# Patient Record
Sex: Male | Born: 1950 | Race: White | Hispanic: No | Marital: Married | Smoking: Current every day smoker
Health system: Southern US, Community
[De-identification: ages and names within clinical notes are randomized; demographics above are authoritative.]

## PROBLEM LIST (undated history)

## (undated) DIAGNOSIS — E785 Hyperlipidemia, unspecified: Secondary | ICD-10-CM

## (undated) DIAGNOSIS — G8929 Other chronic pain: Secondary | ICD-10-CM

## (undated) DIAGNOSIS — F329 Major depressive disorder, single episode, unspecified: Secondary | ICD-10-CM

## (undated) DIAGNOSIS — E079 Disorder of thyroid, unspecified: Secondary | ICD-10-CM

## (undated) DIAGNOSIS — Z765 Malingerer [conscious simulation]: Secondary | ICD-10-CM

## (undated) DIAGNOSIS — I10 Essential (primary) hypertension: Secondary | ICD-10-CM

## (undated) DIAGNOSIS — E119 Type 2 diabetes mellitus without complications: Secondary | ICD-10-CM

---

## 1998-10-03 ENCOUNTER — Other Ambulatory Visit: Admission: RE | Admit: 1998-10-03 | Discharge: 1998-10-03 | Payer: Self-pay | Admitting: Otolaryngology

## 2002-03-01 ENCOUNTER — Encounter: Payer: Self-pay | Admitting: Emergency Medicine

## 2002-03-01 ENCOUNTER — Inpatient Hospital Stay (HOSPITAL_COMMUNITY): Admission: EM | Admit: 2002-03-01 | Discharge: 2002-03-05 | Payer: Self-pay | Admitting: Emergency Medicine

## 2002-03-03 ENCOUNTER — Encounter: Payer: Self-pay | Admitting: Cardiology

## 2002-06-14 ENCOUNTER — Ambulatory Visit (HOSPITAL_COMMUNITY): Admission: RE | Admit: 2002-06-14 | Discharge: 2002-06-14 | Payer: Self-pay | Admitting: Gastroenterology

## 2016-02-11 MED FILL — OxyCONTIN 80 MG T12A: 80 | 30 days supply | Qty: 60 | Fill #0

## 2016-02-11 MED FILL — ALPRAZolam 1 MG TABS: 1 | 30 days supply | Qty: 60 | Fill #0

## 2016-02-11 MED FILL — CARISOPRODOL 350 MG TABLET: 350 | 30 days supply | Qty: 60 | Fill #0

## 2018-07-12 MED FILL — OxyCONTIN 60 MG T12A: 60 | 30 days supply | Qty: 60 | Fill #0

## 2018-07-12 MED FILL — CARISOPRODOL 350 MG TABS: 350 | 30 days supply | Qty: 60 | Fill #0

## 2018-07-12 MED FILL — oxyCODONE HCL 10 MG TABS: 10 | 8 days supply | Qty: 45 | Fill #0

## 2018-07-12 MED FILL — ALPRAZolam 1 MG TABS: 1 | 30 days supply | Qty: 60 | Fill #0

## 2018-08-09 MED FILL — VALSARTAN 80 MG TABLET: 80 | 45 days supply | Qty: 90 | Fill #0

## 2018-08-09 MED FILL — HYDROCHLOROTHIAZIDE 12.5 MG: 12.5 | 45 days supply | Qty: 90 | Fill #0

## 2018-08-10 MED FILL — ALPRAZolam 1 MG TABS: 1 | 30 days supply | Qty: 60 | Fill #0

## 2018-08-10 MED FILL — CARISOPRODOL 350 MG TABS: 350 | 30 days supply | Qty: 60 | Fill #0

## 2018-08-10 MED FILL — oxyCODONE HCL 10 MG TABS: 10 | 7 days supply | Qty: 45 | Fill #0

## 2018-08-10 MED FILL — OxyCONTIN 60 MG T12A: 60 | 30 days supply | Qty: 60 | Fill #0

## 2018-08-14 MED FILL — TAMSULOSIN HCL 0.4 MG CAP: 0.4 | 90 days supply | Qty: 90 | Fill #0

## 2018-08-17 MED FILL — traZODone HCL 50 MG TABS: 50 | 30 days supply | Qty: 15 | Fill #0

## 2018-08-17 MED FILL — ADVAIR 250/50 DISKUS: 250-50 | 30 days supply | Qty: 60 | Fill #0

## 2018-08-17 MED FILL — PROAIR HFA 90 MCG INHALER: 108 (90 BAS | 25 days supply | Qty: 9 | Fill #0

## 2018-08-29 MED FILL — FEXOFENADINE HCL 180 MG TAB: 180 | 30 days supply | Qty: 30 | Fill #0

## 2018-08-29 MED FILL — metFORMIN HCL 1000 MG TABS: 1000 | 90 days supply | Qty: 180 | Fill #0

## 2018-09-08 MED FILL — OxyCONTIN 60 MG T12A: 60 | 30 days supply | Qty: 60 | Fill #0

## 2018-09-08 MED FILL — oxyCODONE HCL 10 MG TABS: 10 | 30 days supply | Qty: 60 | Fill #0

## 2018-09-08 MED FILL — ALPRAZolam 1 MG TABS: 1 | 30 days supply | Qty: 30 | Fill #0

## 2018-09-11 MED FILL — CARISOPRODOL 350 MG TABS: 350 | 30 days supply | Qty: 60 | Fill #0

## 2018-09-26 MED FILL — traZODone HCL 50 MG TABS: 50 | 30 days supply | Qty: 30 | Fill #0

## 2018-09-26 MED FILL — TRINTELLIX 10 MG TABLET: 10 | 30 days supply | Qty: 30 | Fill #0

## 2018-10-09 MED FILL — CARISOPRODOL 350 MG TABS: 350 | 30 days supply | Qty: 60 | Fill #0

## 2018-10-09 MED FILL — oxyCODONE HCL 10 MG TABS: 10 | 60 days supply | Qty: 60 | Fill #0

## 2018-10-09 MED FILL — DOK 100 MG SOFTGEL: 100 | 50 days supply | Qty: 100 | Fill #0

## 2018-10-09 MED FILL — OxyCONTIN 60 MG T12A: 60 | 30 days supply | Qty: 60 | Fill #0

## 2018-10-09 MED FILL — ALPRAZolam 1 MG TABS: 1 | 30 days supply | Qty: 30 | Fill #0

## 2018-11-06 MED FILL — FLUOXETINE HCL 20 MG TABS: 20 | 30 days supply | Qty: 30 | Fill #0

## 2018-11-07 MED FILL — CARISOPRODOL 350 MG TABS: 350 | 30 days supply | Qty: 60 | Fill #0

## 2018-11-07 MED FILL — ALPRAZolam 1 MG TABS: 1 | 30 days supply | Qty: 30 | Fill #0

## 2018-11-07 MED FILL — oxyCODONE HCL 15 MG TABS: 15 | 30 days supply | Qty: 60 | Fill #0

## 2018-11-07 MED FILL — OxyCONTIN 60 MG T12A: 60 | 30 days supply | Qty: 60 | Fill #0

## 2018-11-09 MED FILL — LEVOTHYROXINE 200 MCG TAB: 200 | 30 days supply | Qty: 30 | Fill #0

## 2018-11-09 MED FILL — FLUoxetine HCL 40 MG CAPS: 40 | 90 days supply | Qty: 90 | Fill #0

## 2018-12-01 MED FILL — FLUOXETINE HCL 20 MG TABS: 20 | 30 days supply | Qty: 30 | Fill #1

## 2018-12-05 MED FILL — MAGNESIUM OXIDE 400 MG TAB: 400 (240 MG | 60 days supply | Qty: 120 | Fill #0

## 2018-12-08 MED FILL — CARISOPRODOL 350 MG TABS: 350 | 30 days supply | Qty: 60 | Fill #0

## 2018-12-08 MED FILL — OxyCONTIN 60 MG T12A: 60 | 30 days supply | Qty: 60 | Fill #0

## 2018-12-08 MED FILL — oxyCODONE HCL 15 MG TABS: 15 | 30 days supply | Qty: 60 | Fill #0

## 2018-12-08 MED FILL — ALPRAZolam 1 MG TABS: 1 | 15 days supply | Qty: 30 | Fill #0

## 2019-01-05 MED FILL — CARISOPRODOL 350 MG TABS: 350 | 30 days supply | Qty: 60 | Fill #0

## 2019-01-08 MED FILL — ALPRAZolam 1 MG TABS: 1 | 30 days supply | Qty: 30 | Fill #0

## 2019-01-08 MED FILL — OxyCONTIN 60 MG T12A: 60 | 30 days supply | Qty: 60 | Fill #0

## 2019-01-08 MED FILL — oxyCODONE HCL 15 MG TABS: 15 | 30 days supply | Qty: 60 | Fill #0

## 2019-02-02 MED FILL — FLUoxetine HCL 40 MG CAPS: 40 | 30 days supply | Qty: 30 | Fill #0

## 2019-02-06 MED FILL — ALPRAZolam 1 MG TABS: 1 | 30 days supply | Qty: 30 | Fill #0

## 2019-02-06 MED FILL — OxyCONTIN 60 MG T12A: 60 | 30 days supply | Qty: 60 | Fill #0

## 2019-02-06 MED FILL — oxyCODONE HCL 15 MG TABS: 15 | 30 days supply | Qty: 60 | Fill #0

## 2019-02-06 MED FILL — CARISOPRODOL 350 MG TABS: 350 | 30 days supply | Qty: 60 | Fill #0

## 2019-02-25 ENCOUNTER — Encounter (HOSPITAL_BASED_OUTPATIENT_CLINIC_OR_DEPARTMENT_OTHER): Payer: Self-pay

## 2019-02-25 ENCOUNTER — Emergency Department (HOSPITAL_BASED_OUTPATIENT_CLINIC_OR_DEPARTMENT_OTHER)
Admission: EM | Admit: 2019-02-25 | Discharge: 2019-02-26 | Disposition: A | Discharge status: Home / Self Care | Payer: Medicare Other | Attending: Emergency Medicine | Admitting: Emergency Medicine

## 2019-02-25 ENCOUNTER — Other Ambulatory Visit: Payer: Self-pay

## 2019-02-25 ENCOUNTER — Emergency Department (HOSPITAL_BASED_OUTPATIENT_CLINIC_OR_DEPARTMENT_OTHER): Payer: Medicare Other

## 2019-02-25 DIAGNOSIS — E119 Type 2 diabetes mellitus without complications: Secondary | ICD-10-CM | POA: Insufficient documentation

## 2019-02-25 DIAGNOSIS — Z7984 Long term (current) use of oral hypoglycemic drugs: Secondary | ICD-10-CM | POA: Insufficient documentation

## 2019-02-25 DIAGNOSIS — A084 Viral intestinal infection, unspecified: Secondary | ICD-10-CM | POA: Diagnosis not present

## 2019-02-25 DIAGNOSIS — R1110 Vomiting, unspecified: Secondary | ICD-10-CM | POA: Diagnosis present

## 2019-02-25 DIAGNOSIS — K529 Noninfective gastroenteritis and colitis, unspecified: Secondary | ICD-10-CM

## 2019-02-25 DIAGNOSIS — E876 Hypokalemia: Secondary | ICD-10-CM | POA: Diagnosis not present

## 2019-02-25 DIAGNOSIS — Z79899 Other long term (current) drug therapy: Secondary | ICD-10-CM | POA: Diagnosis not present

## 2019-02-25 DIAGNOSIS — R112 Nausea with vomiting, unspecified: Secondary | ICD-10-CM

## 2019-02-25 HISTORY — DX: Disorder of thyroid, unspecified: E07.9

## 2019-02-25 HISTORY — DX: Type 2 diabetes mellitus without complications: E11.9

## 2019-02-25 HISTORY — DX: Other chronic pain: G89.29

## 2019-02-25 LAB — URINALYSIS, ROUTINE W REFLEX MICROSCOPIC
Bilirubin Urine: NEGATIVE
Glucose, UA: NEGATIVE mg/dL
Hgb urine dipstick: NEGATIVE
Ketones, ur: NEGATIVE mg/dL
Leukocytes,Ua: NEGATIVE
Nitrite: NEGATIVE
Protein, ur: 30 mg/dL — AB
Specific Gravity, Urine: 1.015 (ref 1.005–1.030)
pH: 7 (ref 5.0–8.0)

## 2019-02-25 LAB — COMPREHENSIVE METABOLIC PANEL
ALT: 15 U/L (ref 0–44)
AST: 21 U/L (ref 15–41)
Albumin: 3.7 g/dL (ref 3.5–5.0)
Alkaline Phosphatase: 43 U/L (ref 38–126)
Anion gap: 11 (ref 5–15)
BUN: 10 mg/dL (ref 8–23)
CO2: 23 mmol/L (ref 22–32)
Calcium: 8.4 mg/dL — ABNORMAL LOW (ref 8.9–10.3)
Chloride: 95 mmol/L — ABNORMAL LOW (ref 98–111)
Creatinine, Ser: 0.75 mg/dL (ref 0.61–1.24)
GFR calc Af Amer: >60 mL/min (ref >60)
GFR calc non Af Amer: >60 mL/min (ref >60)
Glucose, Bld: 106 mg/dL — ABNORMAL HIGH (ref 70–99)
Potassium: 3.1 mmol/L — ABNORMAL LOW (ref 3.5–5.1)
Sodium: 129 mmol/L — ABNORMAL LOW (ref 135–145)
Total Bilirubin: 0.3 mg/dL (ref 0.3–1.2)
Total Protein: 6.5 g/dL (ref 6.5–8.1)

## 2019-02-25 LAB — CBC WITH DIFFERENTIAL/PLATELET
Abs Immature Granulocytes: 0.02 10*3/uL (ref 0.00–0.07)
Basophils Absolute: 0 10*3/uL (ref 0.0–0.1)
Basophils Relative: 0 %
Eosinophils Absolute: 0 10*3/uL (ref 0.0–0.5)
Eosinophils Relative: 0 %
HCT: 41.2 % (ref 39.0–52.0)
Hemoglobin: 14.2 g/dL (ref 13.0–17.0)
Immature Granulocytes: 0 %
Lymphocytes Relative: 37 %
Lymphs Abs: 2.7 10*3/uL (ref 0.7–4.0)
MCH: 32.5 pg (ref 26.0–34.0)
MCHC: 34.5 g/dL (ref 30.0–36.0)
MCV: 94.3 fL (ref 80.0–100.0)
Monocytes Absolute: 0.6 10*3/uL (ref 0.1–1.0)
Monocytes Relative: 8 %
Neutro Abs: 3.9 10*3/uL (ref 1.7–7.7)
Neutrophils Relative %: 55 %
Platelets: 308 10*3/uL (ref 150–400)
RBC: 4.37 MIL/uL (ref 4.22–5.81)
RDW: 14.4 % (ref 11.5–15.5)
WBC: 7.3 10*3/uL (ref 4.0–10.5)
nRBC: 0 % (ref 0.0–0.2)

## 2019-02-25 LAB — LIPASE, BLOOD: Lipase: 36 U/L (ref 11–51)

## 2019-02-25 LAB — URINALYSIS, MICROSCOPIC (REFLEX): RBC / HPF: NONE SEEN RBC/hpf (ref 0–5)

## 2019-02-25 MED ORDER — IOHEXOL 300 MG/ML  SOLN
80.0000 mL | Freq: Once | INTRAMUSCULAR | Status: AC | PRN
  Administered 2019-02-25: 22:00:00 80 mL via INTRAVENOUS

## 2019-02-25 MED ORDER — POTASSIUM CHLORIDE CRYS ER 20 MEQ PO TBCR
40.0000 meq | EXTENDED_RELEASE_TABLET | Freq: Once | ORAL | Status: AC
  Administered 2019-02-25: 23:00:00 40 meq via ORAL
  Filled 2019-02-25: qty 2

## 2019-02-25 MED ORDER — POTASSIUM CHLORIDE CRYS ER 20 MEQ PO TBCR: 20.0000 meq | EXTENDED_RELEASE_TABLET | Freq: Every day | ORAL | 0 refills | Status: AC

## 2019-02-25 MED ORDER — ONDANSETRON HCL 4 MG/2ML IJ SOLN
4.0000 mg | Freq: Once | INTRAMUSCULAR | Status: AC
  Administered 2019-02-25: 20:00:00 4 mg via INTRAVENOUS
  Filled 2019-02-25: qty 2

## 2019-02-25 MED ORDER — ALPRAZOLAM 0.5 MG PO TABS
1.0000 mg | ORAL_TABLET | Freq: Once | ORAL | Status: AC
  Administered 2019-02-25: 23:00:00 1 mg via ORAL
  Filled 2019-02-25: qty 2

## 2019-02-25 MED ORDER — FENTANYL CITRATE (PF) 100 MCG/2ML IJ SOLN
50.0000 ug | INTRAMUSCULAR | Status: DC | PRN
  Administered 2019-02-25: 20:00:00 50 ug via INTRAVENOUS
  Filled 2019-02-25: qty 2

## 2019-02-25 MED ORDER — ONDANSETRON 4 MG PO TBDP: 4.0000 mg | ORAL_TABLET | Freq: Three times a day (TID) | ORAL | 0 refills | Status: AC | PRN

## 2019-02-25 MED ORDER — OXYCODONE HCL 5 MG PO TABS
15.0000 mg | ORAL_TABLET | Freq: Once | ORAL | Status: AC
  Administered 2019-02-25: 23:00:00 15 mg via ORAL
  Filled 2019-02-25: qty 3

## 2019-02-25 MED ORDER — HYDROMORPHONE HCL 1 MG/ML IJ SOLN
1.0000 mg | Freq: Once | INTRAMUSCULAR | Status: AC
  Administered 2019-02-25: 22:00:00 1 mg via INTRAVENOUS
  Filled 2019-02-25: qty 1

## 2019-02-25 NOTE — ED Notes (Signed)
Patient transported to CT 

## 2019-02-25 NOTE — ED Triage Notes (Addendum)
Pt arrived via [facility] from the [facility]. Pt c/o N/V/D x 2 days. Pt has a hx of chronic back pain and pt been unable to keep down his pain medication. Pt states he has vomited 8-10 x in the past 24 hours.

## 2019-02-25 NOTE — ED Notes (Signed)
Crackers and ginger ale given.

## 2019-02-25 NOTE — Discharge Instructions (Signed)
If you develop worsening, continued, or recurrent abdominal pain, uncontrolled vomiting, fever, chest or back pain, or any other new/concerning symptoms then return to the ER for evaluation.  

## 2019-02-25 NOTE — ED Provider Notes (Signed)
[facility] EMERGENCY DEPARTMENT Provider Note   CSN: [id number] Arrival date & time: 02/25/19  1903     History   Chief Complaint Chief Complaint  Patient presents with  . Emesis    HPI Ricky Chambers is a [age] y.o. male.     68 y.o male with a PMH of chronic back pain presents to the ED via EMS with a chief complaint of chronic back pain, nausea and vomiting.  Patient reports he has had 8 episodes of nonbilious nonbloody emesis during the past 24 hours.  He reports he has vomited his pain medication, reports he has been unable to tolerate any food or liquids, has had some decrease in eating.  Patient is currently staying at a independent living facility, reports he first enrolled there in March, states he does not like the food they provide for him there, he has been total isolation since pandemic started.  Patient reports his pain along his back is worse today as he has not been taking his medication the past 48 hours without vomiting.  He also endorses some generalized abdominal pain, bowel sounds are diminished.  He declines any fever, chest pain, shortness of breath.  The history is provided by the patient.  Emesis Associated symptoms: abdominal pain   Associated symptoms: no arthralgias, no chills, no cough, no fever and no sore throat     Past Medical History:  Diagnosis Date  . Chronic pain   . Diabetes mellitus without complication (HCC)   . Thyroid disease     There are no active problems to display for this patient.   Home Medications    Prior to Admission medications   Medication Sig Start Date End Date Taking? Authorizing Provider  ALPRAZolam Prudy Feeler(XANAX) 1 MG tablet Take 1 mg by mouth at bedtime as needed for anxiety.   Yes [provider]  carisoprodol (SOMA) 350 MG tablet Take 350 mg by mouth 4 (four) times daily as needed for muscle spasms.   Yes [provider]  FLUoxetine (PROZAC) 20 MG tablet Take 20 mg by mouth daily.   Yes  [provider]  levothyroxine (SYNTHROID) 137 MCG tablet Take 137 mcg by mouth daily before breakfast.   Yes [provider]  metFORMIN (GLUCOPHAGE) 500 MG tablet Take by mouth 2 (two) times daily with a meal.   Yes [provider]  OXYCODONE ER PO Take 80 mg by mouth.   Yes [provider]  tamsulosin (FLOMAX) 0.4 MG CAPS capsule Take 0.4 mg by mouth.   Yes [provider]    Family History No family history on file.  Social History Social History   Tobacco Use  . Smoking status: Not on file  Substance Use Topics  . Alcohol use: Not on file  . Drug use: Not on file     Allergies   Codeine   Review of Systems Review of Systems  Constitutional: Negative for chills and fever.  HENT: Negative for ear pain and sore throat.   Eyes: Negative for pain and visual disturbance.  Respiratory: Negative for cough and shortness of breath.   Cardiovascular: Negative for chest pain and palpitations.  Gastrointestinal: Positive for abdominal pain, nausea and vomiting.  Genitourinary: Negative for dysuria and hematuria.  Musculoskeletal: Positive for back pain (Chronic). Negative for arthralgias.  Skin: Negative for color change and rash.  Neurological: Negative for seizures and syncope.  Psychiatric/Behavioral: Positive for confusion.  All other systems reviewed and are negative.  Physical Exam Updated Vital Signs BP (!) 150/104   Pulse 76   Temp 99.5 F (37.5 C) (Oral)   Resp 20   Ht 6\' 2"  (1.88 m)   Wt 59 kg   SpO2 96%   BMI 16.69 kg/m   Physical Exam Vitals signs and nursing note reviewed.  Constitutional:      Appearance: He is well-developed.     Comments: Chronically ill appearing, cachectic.   HENT:     Head: Normocephalic and atraumatic.  Eyes:     General: No scleral icterus.    Conjunctiva/sclera:     Right eye: Right conjunctiva is not injected.     Left eye: Left conjunctiva is not injected.     Pupils:  Pupils are equal, round, and reactive to light.     Comments: Right pupil 3, left pupil 5  Neck:     Musculoskeletal: Normal range of motion. Neck rigidity:    Cardiovascular:     Rate and Rhythm: Normal rate.     Heart sounds: Normal heart sounds.  Pulmonary:     Effort: Pulmonary effort is normal.     Breath sounds: Normal breath sounds. No wheezing.     Comments: Lungs are diminished to auscultation. Chest:     Chest wall: No tenderness.  Abdominal:     General: Abdomen is flat. Bowel sounds are decreased. There is no distension.     Palpations: Abdomen is soft.     Tenderness: There is no abdominal tenderness. There is no right CVA tenderness or left CVA tenderness. Negative signs include Murphy's sign and McBurney's sign.     Comments: Bowel sounds are diminished.   Musculoskeletal:        General: No tenderness or deformity.  Skin:    General: Skin is warm and dry.  Neurological:     Mental Status: He is alert and oriented to person, place, and time.      ED Treatments / Results  Labs (all labs ordered are listed, but only abnormal results are displayed) Labs Reviewed  COMPREHENSIVE METABOLIC PANEL - Abnormal; Notable for the following components:      Result Value   Sodium 129 (*)    Potassium 3.1 (*)    Chloride 95 (*)    Glucose, Bld 106 (*)    Calcium 8.4 (*)    All other components within normal limits  URINALYSIS, ROUTINE W REFLEX MICROSCOPIC - Abnormal; Notable for the following components:   Protein, ur 30 (*)    All other components within normal limits  URINALYSIS, MICROSCOPIC (REFLEX) - Abnormal; Notable for the following components:   Bacteria, UA RARE (*)    All other components within normal limits  CBC WITH DIFFERENTIAL/PLATELET  LIPASE, BLOOD    EKG None  Radiology No results found.  Procedures Procedures (including critical care time)  Medications Ordered in ED Medications  fentaNYL (SUBLIMAZE) injection 50 mcg (50 mcg Intravenous  Given 02/25/19 1957)  HYDROmorphone (DILAUDID) injection 1 mg (has no administration in time range)  ondansetron (ZOFRAN) injection 4 mg (4 mg Intravenous Given 02/25/19 1957)  iohexol (OMNIPAQUE) 300 MG/ML solution 80 mL (80 mLs Intravenous Contrast Given 02/25/19 2134)     Initial Impression / Assessment and Plan / ED Course  I have reviewed the triage vital signs and the nursing notes.  Pertinent labs & imaging results that were available during my care of the patient were reviewed by me and considered in my medical decision making (see chart  for details).       Patient arrived from [facility] facility via EMS, complaining of chronic back pain, nausea, vomiting.  Patient arrived covered in stool, was cleaned off by nursing staff.  Patient reports he had multiple episodes of nonbilious, nonbloody emesis for the past 24 hours, he has also managed to vomit his pain medication, reports his chronic back pain is severe as he has not received any medication in the past 2 days.  Patient moved to s [facility] facility at the beginning of March, reports after COVID happened he has been isolated completely at home by himself, he is provided with meals that he does not particularly like, he reports disliking the facility.  Derm evaluation patient appears cachectic, he is chronically ill-appearing.  Lungs are diminished to auscultation.  Bowel sounds are diminished to auscultation, there is no tenderness with palpation of the abdomen on my exam.  Patient's pupils are different sizes, right side is at 3, left side is likely a 5.  They are reactive.  He denies any headache, shortness of breath, chest pain.  Will obtain labs along with CT imaging in order to further evaluate patient's condition.  CMP with some hyponatremia, hypokalemia.  Creatinine level is within normal limits.  CBC without any leukocytosis, hemoglobin is within normal limits.  Lipase level is normal, UA showed rare bacteria, 0-5 white blood  cell count.  No nitrites or leukocytes.  Will obtain CT to further evaluate patient's abdominal pain.  Of note, patient is a very poor historian, unable to obtain an accurate history.   Patient signed out to Dr. Criss Alvine pending CT Abd and further disposition.    Portions of this note were generated with Scientist, clinical (histocompatibility and immunogenetics). Dictation errors may occur despite best attempts at proofreading.  Final Clinical Impressions(s) / ED Diagnoses   Final diagnoses:  Non-intractable vomiting with nausea, unspecified vomiting type    ED Discharge Orders    None       Claude Manges, Cordelia Poche 02/25/19 2158    Pricilla Loveless, MD 02/25/19 2352

## 2019-02-25 NOTE — ED Notes (Signed)
Contacted [facility] to arrange for transport back to [facility] @ [address])

## 2019-02-26 NOTE — ED Notes (Addendum)
[facility] arrived to transport pt home. At this time the pt began stating he is not going back to [facility]. Pt stated they do not feed him good food. Pt states, "they give me small portions of egg, and they put red and green peppers there even though I told them I can not eat them." Pt was asked what expectations he had and what could be done. Pt stated, "I will just walk off a bridge, or step in front of a car.? This was the first mention of any self harm behavior. Dr. Dina Rich notified and came to pt's bedside. Dr. Dina Rich advised the patients ideas were vague, but that the pt did exhibit signs of depression. She does not believe the pt will act upon his threats and that he is safe to return home. Pt was encouraged to reach out to his support system, PCP, and family to assist him.

## 2019-02-26 NOTE — ED Notes (Signed)
Called pt's sister-in-law, Manson Allan, and updated her with pt's permission. Advised her pt was being discharge to the [facility] and will be transported by [facility]. Per sister-in-law she stated pt has been upset about not receiving enough narcotics. She states the pt is possibly being manipulative in order to get more pain medication. Pt sister-in-law was agreeable to the plan of pt returning to his home.

## 2019-02-26 NOTE — ED Provider Notes (Signed)
At time of discharge, I was informed by nursing that patient was not wanting to leave.  He is unhappy with his living situation.  He also told the nurse that "I guess I will just walk out of here" bridge."  I have interviewed the patient myself.  He seems depressed.  He is unhappy with his living situation.  He is fixated on the fact that he is not given fresh food.  I asked him whether he was truly planning to hurt himself.  He is very vague and states "I just do not know what to do."  He also reports that he is very unhappy that he is being isolated in his living facility because of current pandemic.  I have encouraged him to discuss with his family his concerns as well as with the living facility.  He does have a depressed mood but I feel he is safe for discharge back.   Merryl Hacker, MD 02/26/19 [phone]

## 2019-02-28 ENCOUNTER — Encounter (HOSPITAL_BASED_OUTPATIENT_CLINIC_OR_DEPARTMENT_OTHER): Payer: Self-pay | Admitting: Emergency Medicine

## 2019-02-28 ENCOUNTER — Emergency Department (HOSPITAL_BASED_OUTPATIENT_CLINIC_OR_DEPARTMENT_OTHER)
Admission: EM | Admit: 2019-02-28 | Discharge: 2019-02-28 | Disposition: A | Discharge status: Home / Self Care | Payer: Medicare Other | Attending: Emergency Medicine | Admitting: Emergency Medicine

## 2019-02-28 ENCOUNTER — Other Ambulatory Visit: Payer: Self-pay

## 2019-02-28 ENCOUNTER — Emergency Department (HOSPITAL_BASED_OUTPATIENT_CLINIC_OR_DEPARTMENT_OTHER): Payer: Medicare Other

## 2019-02-28 DIAGNOSIS — Z79899 Other long term (current) drug therapy: Secondary | ICD-10-CM | POA: Diagnosis not present

## 2019-02-28 DIAGNOSIS — W010XXA Fall on same level from slipping, tripping and stumbling without subsequent striking against object, initial encounter: Secondary | ICD-10-CM | POA: Diagnosis not present

## 2019-02-28 DIAGNOSIS — E119 Type 2 diabetes mellitus without complications: Secondary | ICD-10-CM | POA: Diagnosis not present

## 2019-02-28 DIAGNOSIS — M542 Cervicalgia: Secondary | ICD-10-CM | POA: Diagnosis not present

## 2019-02-28 DIAGNOSIS — Y92129 Unspecified place in nursing home as the place of occurrence of the external cause: Secondary | ICD-10-CM | POA: Diagnosis not present

## 2019-02-28 DIAGNOSIS — Y9389 Activity, other specified: Secondary | ICD-10-CM | POA: Insufficient documentation

## 2019-02-28 DIAGNOSIS — M549 Dorsalgia, unspecified: Secondary | ICD-10-CM | POA: Insufficient documentation

## 2019-02-28 DIAGNOSIS — F17200 Nicotine dependence, unspecified, uncomplicated: Secondary | ICD-10-CM | POA: Insufficient documentation

## 2019-02-28 DIAGNOSIS — Y998 Other external cause status: Secondary | ICD-10-CM | POA: Diagnosis not present

## 2019-02-28 DIAGNOSIS — Z7984 Long term (current) use of oral hypoglycemic drugs: Secondary | ICD-10-CM | POA: Insufficient documentation

## 2019-02-28 DIAGNOSIS — R102 Pelvic and perineal pain: Secondary | ICD-10-CM | POA: Diagnosis not present

## 2019-02-28 DIAGNOSIS — W19XXXA Unspecified fall, initial encounter: Secondary | ICD-10-CM

## 2019-02-28 HISTORY — DX: Major depressive disorder, single episode, unspecified: F32.9

## 2019-02-28 HISTORY — DX: Essential (primary) hypertension: I10

## 2019-02-28 HISTORY — DX: Other chronic pain: G89.29

## 2019-02-28 HISTORY — DX: Hyperlipidemia, unspecified: E78.5

## 2019-02-28 MED ORDER — HYDROMORPHONE HCL 1 MG/ML IJ SOLN
2.0000 mg | Freq: Once | INTRAMUSCULAR | Status: AC
  Administered 2019-02-28: 2 mg via INTRAMUSCULAR
  Filled 2019-02-28: qty 2

## 2019-02-28 MED ORDER — HYDROMORPHONE HCL 1 MG/ML IJ SOLN
1.0000 mg | Freq: Once | INTRAMUSCULAR | Status: AC
  Administered 2019-02-28: 06:00:00 1 mg via INTRAMUSCULAR
  Filled 2019-02-28: qty 1

## 2019-02-28 NOTE — Discharge Instructions (Signed)
Your imaging today all showed no new fractures or dislocations.  You do have all of your old injuries on the imaging.  Please call to follow-up with your pain team as they may need to escalate your pain regimen.  If any symptoms change or worsen, please return to the nearest emergency department

## 2019-02-28 NOTE — ED Notes (Signed)
Pt returned from CT. Pt refused to lay on back or cooperate with any radiology studies. MD aware.

## 2019-02-28 NOTE — ED Provider Notes (Signed)
7:07 AM Care assumed from Dr. Christy Gentles.  At time of transfer care, patient is awaiting results of CT and x-ray imaging to find abnormalities after his fall.  If no injuries are seen that are new, plan of care will be discharge patient home to follow-up with his PCP and chronic pain team.    8:49 AM Images of all returned showing no acute injuries.  Old injuries with incomplete healing are seen.  Patient was given 1 more dose of pain medication here but he is going to call his primary team for further pain management at home.  Patient will be discharged for outpatient management.   Clinical Impression: 1. Fall, initial encounter   2. Acute back pain, unspecified back location, unspecified back pain laterality     Disposition: Discharge  Condition: Good  I have discussed the results, Dx and Tx plan with the pt(& family if present). He/she/they expressed understanding and agree(s) with the plan. Discharge instructions discussed at great length. Strict return precautions discussed and pt &/or family have verbalized understanding of the instructions. No further questions at time of discharge.    New Prescriptions   No medications on file    Follow Up: your pain team     Rachel [address] [id number] [place] [phone]           Cali Cuartas, Gwenyth Allegra, MD 02/28/19 [phone]

## 2019-02-28 NOTE — ED Provider Notes (Signed)
[facility] EMERGENCY DEPARTMENT Provider Note   CSN: [id number] Arrival date & time: 02/28/19  5009     History   Chief Complaint Chief Complaint  Patient presents with  . Fall    HPI NIMROD WENDT is a 68 y.o. male.     The history is provided by the patient.  Fall This is a new problem. The current episode started less than 1 hour ago. The problem occurs constantly. The problem has been gradually worsening. Pertinent negatives include no chest pain, no abdominal pain and no headaches. Exacerbated by: Movement. The symptoms are relieved by rest.   Patient with history of chronic pain, diabetes, thyroid disease presents with fall.  Patient lives in assisted living facility He reports he try to get up to go the restroom when he fell landing on his knees and reinjured his back and neck.  No head injury or LOC.  He reports pain throughout his neck and back.  He has some pain in his pelvis. He reports his back hurts when he moves his legs.  Past Medical History:  Diagnosis Date  . Chronic back pain   . Chronic pain   . Diabetes mellitus without complication ([place])   . Thyroid disease     There are no active problems to display for this patient.   Past Surgical History:  Procedure Laterality Date  . BACK SURGERY    . KNEE SURGERY          Home Medications    Prior to Admission medications   Medication Sig Start Date End Date Taking? Authorizing Provider  ALPRAZolam Duanne Moron) 1 MG tablet Take 1 mg by mouth at bedtime as needed for anxiety.    [provider]  carisoprodol (SOMA) 350 MG tablet Take 350 mg by mouth 4 (four) times daily as needed for muscle spasms.    [provider]  FLUoxetine (PROZAC) 20 MG tablet Take 20 mg by mouth daily.    [provider]  levothyroxine (SYNTHROID) 137 MCG tablet Take 137 mcg by mouth daily before breakfast.    [provider]  metFORMIN (GLUCOPHAGE) 500 MG tablet Take by mouth 2 (two)  times daily with a meal.    [provider]  ondansetron (ZOFRAN ODT) 4 MG disintegrating tablet Take 1 tablet (4 mg total) by mouth every 8 (eight) hours as needed. 02/25/19   Sherwood Gambler, MD  OXYCODONE ER PO Take 80 mg by mouth.    [provider]  potassium chloride SA (K-DUR) 20 MEQ tablet Take 1 tablet (20 mEq total) by mouth daily. 02/25/19   Sherwood Gambler, MD  tamsulosin (FLOMAX) 0.4 MG CAPS capsule Take 0.4 mg by mouth.    [provider]    Family History No family history on file.  Social History Social History   Tobacco Use  . Smoking status: Current Every Day Smoker  . Smokeless tobacco: Never Used  Substance Use Topics  . Alcohol use: Not Currently    Frequency: Never  . Drug use: Not on file     Allergies   Codeine   Review of Systems Review of Systems  Constitutional: Negative for fever.  Cardiovascular: Negative for chest pain.  Gastrointestinal: Negative for abdominal pain.  Musculoskeletal: Positive for arthralgias, back pain and neck pain.  Neurological: Negative for headaches.  All other systems reviewed and are negative.    Physical Exam Updated Vital Signs BP (!) 147/89 (BP Location: Right Arm)   Pulse 76  Temp 97.9 F (36.6 C) (Oral)   Resp 18   Wt 59 kg   SpO2 99%   BMI 16.69 kg/m   Physical Exam CONSTITUTIONAL: Elderly and frail, writhing around the bed in pain HEAD: Normocephalic/atraumatic EYES: EOMI/PERRL ENMT: Mucous membranes moist NECK: supple no meningeal signs SPINE/BACK: Diffuse tenderness to the cervical/thoracic/lumbar spine.  No bruising/crepitance/stepoffs noted to spine CV: S1/S2 noted, no murmurs/rubs/gallops noted LUNGS: Lungs are clear to auscultation bilaterally, no apparent distress ABDOMEN: soft, nontender NEURO: Pt is awake/alert/appropriate, moves all extremitiesx4.  No facial droop.  GCS 15.  Equal motor strength noted lower extremities but limited due to back pain EXTREMITIES:  pulses normal/equal, full ROM, pelvis stable, mild tenderness of range of motion right hip.  No tenderness noted to either knee or ankle.  Upper extremities are unremarkable.  All other extremities/joints palpated/ranged and nontender SKIN: warm, color normal PSYCH: Anxious  ED Treatments / Results  Labs (all labs ordered are listed, but only abnormal results are displayed) Labs Reviewed - No data to display  EKG None  Radiology No results found.  Procedures Procedures   Medications Ordered in ED Medications  HYDROmorphone (DILAUDID) injection 1 mg (1 mg Intramuscular Given 02/28/19 0546)  HYDROmorphone (DILAUDID) injection 2 mg (2 mg Intramuscular Given 02/28/19 0610)     Initial Impression / Assessment and Plan / ED Course  I have reviewed the triage vital signs and the nursing notes.  Pertinent  imaging results that were available during my care of the patient were reviewed by me and considered in my medical decision making (see chart for details).        5:45 AM Patient presents after a fall while at the assisted living facility.  He reports landing on his knees which reaggravated his neck and back pain.  Denies head injury, denies LOC. Patient has known history of chronic pain. Patient has known history of compression fractures throughout the spine.  Will proceed directly with CT imaging of C/T/L-spine.  We will also obtain pelvic x-ray.  No other signs of acute traumatic injury.  No signs of head injury Patient reports falling while going to the bathroom at night.  Denies LOC, low suspicion for syncope 6:05 AM Patient unable to lay flat for CT scan.  Patient reports he lost and has run out of his pain medication.  Suspect there is some component of medication withdrawal.  Will give another dose of pain meds so we can get imaging.  6:57 AM Plan at signout to Dr Rush Landmark is to f/u on CT/Xray imaging If no acute findings, patient can be discharged back to ALF Final  Clinical Impressions(s) / ED Diagnoses   Final diagnoses:  None    ED Discharge Orders    None       Zadie Rhine, MD 02/28/19 [phone]

## 2019-02-28 NOTE — ED Notes (Signed)
ED Provider at bedside. 

## 2019-02-28 NOTE — ED Triage Notes (Addendum)
Pt states he fell tonight c/o knee pain bilaterally, neck and back pain. Pt lives at [facility]

## 2019-02-28 NOTE — ED Notes (Signed)
Pt states he can lay flat for radiology now. CT tech aware.

## 2019-03-03 ENCOUNTER — Encounter (HOSPITAL_BASED_OUTPATIENT_CLINIC_OR_DEPARTMENT_OTHER): Payer: Self-pay | Admitting: Emergency Medicine

## 2019-03-03 ENCOUNTER — Emergency Department (HOSPITAL_BASED_OUTPATIENT_CLINIC_OR_DEPARTMENT_OTHER)
Admission: EM | Admit: 2019-03-03 | Discharge: 2019-03-03 | Disposition: A | Discharge status: Home / Self Care | Payer: Medicare Other | Attending: Emergency Medicine | Admitting: Emergency Medicine

## 2019-03-03 ENCOUNTER — Emergency Department (HOSPITAL_BASED_OUTPATIENT_CLINIC_OR_DEPARTMENT_OTHER): Payer: Medicare Other

## 2019-03-03 ENCOUNTER — Other Ambulatory Visit: Payer: Self-pay

## 2019-03-03 DIAGNOSIS — G8921 Chronic pain due to trauma: Secondary | ICD-10-CM | POA: Diagnosis not present

## 2019-03-03 DIAGNOSIS — M545 Low back pain: Secondary | ICD-10-CM | POA: Diagnosis not present

## 2019-03-03 DIAGNOSIS — Y939 Activity, unspecified: Secondary | ICD-10-CM | POA: Insufficient documentation

## 2019-03-03 DIAGNOSIS — Z7984 Long term (current) use of oral hypoglycemic drugs: Secondary | ICD-10-CM | POA: Insufficient documentation

## 2019-03-03 DIAGNOSIS — E119 Type 2 diabetes mellitus without complications: Secondary | ICD-10-CM | POA: Diagnosis not present

## 2019-03-03 DIAGNOSIS — R4689 Other symptoms and signs involving appearance and behavior: Secondary | ICD-10-CM | POA: Diagnosis not present

## 2019-03-03 DIAGNOSIS — Y929 Unspecified place or not applicable: Secondary | ICD-10-CM | POA: Diagnosis not present

## 2019-03-03 DIAGNOSIS — I10 Essential (primary) hypertension: Secondary | ICD-10-CM | POA: Insufficient documentation

## 2019-03-03 DIAGNOSIS — M542 Cervicalgia: Secondary | ICD-10-CM | POA: Diagnosis present

## 2019-03-03 DIAGNOSIS — W19XXXA Unspecified fall, initial encounter: Secondary | ICD-10-CM | POA: Insufficient documentation

## 2019-03-03 DIAGNOSIS — Y999 Unspecified external cause status: Secondary | ICD-10-CM | POA: Insufficient documentation

## 2019-03-03 DIAGNOSIS — R296 Repeated falls: Secondary | ICD-10-CM | POA: Diagnosis not present

## 2019-03-03 DIAGNOSIS — Z79899 Other long term (current) drug therapy: Secondary | ICD-10-CM | POA: Diagnosis not present

## 2019-03-03 MED ORDER — OXYCODONE HCL 5 MG PO TABS
15.0000 mg | ORAL_TABLET | Freq: Once | ORAL | Status: AC
  Administered 2019-03-03: 07:00:00 15 mg via ORAL
  Filled 2019-03-03: qty 3

## 2019-03-03 NOTE — ED Notes (Signed)
Pt called for assistance with the c-collar. Pt was angry stating that it was chocking him. This EMT adjusted the collar. Pt then stated that it was not comfortable and he wanted it off. This EMT informed the nurse and doctor.

## 2019-03-03 NOTE — ED Provider Notes (Addendum)
[facility] Provider Note: Georgena Spurling, MD, FACEP  CSN: [id number] MRN: [id number] ARRIVAL: 03/03/19 at [place]: [place]  03/03/19 4:08 AM Ricky Chambers is a 68 y.o. male brought from his assisted living facility after an unwitnessed fall.  Patient states he does not know what time he fell.  He has had multiple falls recently and was seen in the ED for this 4 days ago.  He has chronic back pain but has reportedly not been following up with his pain management doctor.  He is complaining of 10 out of 10 pain in his neck and back.  He cannot say if this is new pain or his chronic pain.  Pain is worse with movement and certain positions.  He will not lie flat on his back.  His nurse, Raquel Sarna, took care of him on his previous visit and states he presented the same way at that time.  The pain in his back is also radiating to his legs.  There is no deformity noted by EMS.  They did place him in a cervical collar for transport.   Past Medical History:  Diagnosis Date   Chronic back pain    Chronic pain    Diabetes mellitus without complication ([place])    Dyslipidemia    Hypertension    Major depressive disorder    Thyroid disease     Past Surgical History:  Procedure Laterality Date   BACK SURGERY     KNEE SURGERY      History reviewed. No pertinent family history.  Social History   Tobacco Use   Smoking status: Current Every Day Smoker   Smokeless tobacco: Never Used  Substance Use Topics   Alcohol use: Not Currently    Frequency: Never   Drug use: Not on file    Prior to Admission medications   Medication Sig Start Date End Date Taking? Authorizing Provider  ALPRAZolam Duanne Moron) 1 MG tablet Take 1 mg by mouth at bedtime as needed for anxiety.    [provider]  carisoprodol (SOMA) 350 MG tablet Take 350 mg by mouth 4 (four) times daily as needed for muscle spasms.    [provider]  FLUoxetine (PROZAC) 20 MG tablet Take 20 mg by mouth daily.    [provider]  levothyroxine (SYNTHROID) 137 MCG tablet Take 137 mcg by mouth daily before breakfast.    [provider]  metFORMIN (GLUCOPHAGE) 500 MG tablet Take by mouth 2 (two) times daily with a meal.    [provider]  ondansetron (ZOFRAN ODT) 4 MG disintegrating tablet Take 1 tablet (4 mg total) by mouth every 8 (eight) hours as needed. 02/25/19   Sherwood Gambler, MD  oxyCODONE (ROXICODONE) 15 MG immediate release tablet  02/06/19   [provider]  OXYCODONE ER PO Take 60 mg by mouth 2 (two) times daily.     [provider]  potassium chloride SA (K-DUR) 20 MEQ tablet Take 1 tablet (20 mEq total) by mouth daily. 02/25/19   Sherwood Gambler, MD  tamsulosin (FLOMAX) 0.4 MG CAPS capsule Take 0.4 mg by mouth.    [provider]    Allergies Codeine   REVIEW OF SYSTEMS  Negative except as noted here or in the History of Present Illness.   PHYSICAL EXAMINATION  Initial Vital Signs Blood pressure (!) 148/92, pulse 82, temperature 98.3 F (36.8 C), temperature source Oral, resp.  rate (!) 22, SpO2 98 %.  Examination General: Well-developed, well-nourished male in no acute distress; appearance consistent with age of record HENT: normocephalic; atraumatic Eyes: Right pupil 2 mm and sluggish; left pupil 3 mm, round and reactive; extraocular muscles grossly intact but poor cooperation with exam Neck: Immobilized in cervical collar; posterior tenderness Heart: regular rate and rhythm Lungs: clear to auscultation bilaterally Abdomen: soft; nondistended; nontender; bowel sounds present Back: Generalized tenderness Extremities: No acute deformity; pulses normal; pain on flexion of hips bilaterally Neurologic: Awake, alert and oriented; motor function intact in all extremities and symmetric; no facial droop Skin: Warm and dry Psychiatric: Moaning; angry mood  with congruent affect   RESULTS  Summary of this visit's results, reviewed by myself:   EKG Interpretation  Date/Time:    Ventricular Rate:    PR Interval:    QRS Duration:   QT Interval:    QTC Calculation:   R Axis:     Text Interpretation:        Laboratory Studies: No results found for this or any previous visit (from the past 24 hour(s)). Imaging Studies: Ct Head Wo Contrast  Result Date: 03/03/2019 CLINICAL DATA:  Unwitnessed fall EXAM: CT HEAD WITHOUT CONTRAST TECHNIQUE: Contiguous axial images were obtained from the base of the skull through the vertex without intravenous contrast. COMPARISON:  Head CT 12/02/2018 FINDINGS: Brain: There is no mass, hemorrhage or extra-axial collection. The size and configuration of the ventricles and extra-axial CSF spaces are normal. There is hypoattenuation of the white matter, most commonly indicating chronic small vessel disease. Vascular: No abnormal hyperdensity of the major intracranial arteries or dural venous sinuses. No intracranial atherosclerosis. Skull: The visualized skull base, calvarium and extracranial soft tissues are normal. Sinuses/Orbits: No fluid levels or advanced mucosal thickening of the visualized paranasal sinuses. No mastoid or middle ear effusion. The orbits are normal. IMPRESSION: Chronic small vessel disease without acute intracranial abnormality. Electronically Signed   By: Deatra RobinsonKevin  Herman M.D.   On: 03/03/2019 05:20   Ct Cervical Spine Wo Contrast  Result Date: 03/03/2019 CLINICAL DATA:  Fall EXAM: CT CERVICAL SPINE WITHOUT CONTRAST TECHNIQUE: Multidetector CT imaging of the cervical spine was performed without intravenous contrast. Multiplanar CT image reconstructions were also generated. COMPARISON:  None. FINDINGS: Alignment: No static subluxation. Facets are aligned. Occipital condyles and the lateral masses of C1 and C2 are normally approximated. Skull base and vertebrae: No acute fracture. Soft tissues and  spinal canal: No prevertebral fluid or swelling. No visible canal hematoma. Disc levels: Multilevel disc space narrowing with endplate osteophytosis. There is moderate spinal canal stenosis at the C4-7 levels. Upper chest: No pneumothorax, pulmonary nodule or pleural effusion. Other: Normal visualized paraspinal cervical soft tissues. IMPRESSION: No acute fracture or static subluxation of the cervical spine. Electronically Signed   By: Deatra RobinsonKevin  Herman M.D.   On: 03/03/2019 05:31   Ct Thoracic Spine Wo Contrast  Result Date: 03/03/2019 CLINICAL DATA:  Fall EXAM: CT THORACIC AND LUMBAR SPINE WITHOUT CONTRAST TECHNIQUE: Multidetector CT imaging of the thoracic and lumbar spine was performed without contrast. Multiplanar CT image reconstructions were also generated. COMPARISON:  None. FINDINGS: CT THORACIC SPINE FINDINGS Alignment: Exaggerated kyphosis. No static subluxation. Vertebrae: Fractures of T11 and T12 are redemonstrated. No new compression fracture. Paraspinal and other soft tissues: Calcific aortic atherosclerosis. Increased opacity in the lingula. Disc levels: There is at least moderate spinal canal stenosis at T12-L1 due to retropulsion of the posterosuperior aspect of the L1 endplate. CT LUMBAR SPINE  FINDINGS Segmentation: 5 lumbar type vertebrae. Alignment: Normal. Vertebrae: L5-S1 PLIF. L1 fracture is unchanged compared to 02/28/2019. Height loss at L4 and L5 are unchanged. Paraspinal and other soft tissues: Diffuse aortic atherosclerosis. Disc levels: Moderate spinal canal stenosis at L1 due to retropulsion of the posterosuperior corner. This is unchanged. Unchanged moderate right L4 neural foraminal stenosis. IMPRESSION: 1. T11, T12 and L1 compression fractures, unchanged from 02/28/2019. 2. At least moderate spinal canal stenosis at the L1 level due to retropulsion of the posterosuperior corner. 3.  Aortic atherosclerosis (ICD10-I70.0). Electronically Signed   By: Deatra Robinson M.D.   On: 03/03/2019  05:16   Ct Lumbar Spine Wo Contrast  Result Date: 03/03/2019 CLINICAL DATA:  Fall EXAM: CT THORACIC AND LUMBAR SPINE WITHOUT CONTRAST TECHNIQUE: Multidetector CT imaging of the thoracic and lumbar spine was performed without contrast. Multiplanar CT image reconstructions were also generated. COMPARISON:  None. FINDINGS: CT THORACIC SPINE FINDINGS Alignment: Exaggerated kyphosis. No static subluxation. Vertebrae: Fractures of T11 and T12 are redemonstrated. No new compression fracture. Paraspinal and other soft tissues: Calcific aortic atherosclerosis. Increased opacity in the lingula. Disc levels: There is at least moderate spinal canal stenosis at T12-L1 due to retropulsion of the posterosuperior aspect of the L1 endplate. CT LUMBAR SPINE FINDINGS Segmentation: 5 lumbar type vertebrae. Alignment: Normal. Vertebrae: L5-S1 PLIF. L1 fracture is unchanged compared to 02/28/2019. Height loss at L4 and L5 are unchanged. Paraspinal and other soft tissues: Diffuse aortic atherosclerosis. Disc levels: Moderate spinal canal stenosis at L1 due to retropulsion of the posterosuperior corner. This is unchanged. Unchanged moderate right L4 neural foraminal stenosis. IMPRESSION: 1. T11, T12 and L1 compression fractures, unchanged from 02/28/2019. 2. At least moderate spinal canal stenosis at the L1 level due to retropulsion of the posterosuperior corner. 3.  Aortic atherosclerosis (ICD10-I70.0). Electronically Signed   By: Deatra Robinson M.D.   On: 03/03/2019 05:16    ED COURSE and MDM  Nursing notes and initial vitals signs, including pulse oximetry, reviewed.  Vitals:   03/03/19 0359 03/03/19 0407  BP:  (!) 148/92  Pulse:  82  Resp:  (!) 22  Temp:  98.3 F (36.8 C)  TempSrc:  Oral  SpO2: 100% 98%   No acute changes seen on radiographs.  I suspect the patient's pain is primarily chronic.  5:54 AM Patient angrily alleges to me that he has not seen the doctor.  I explained to the patient that I spent at least  20 minutes assessing him on his initial arrival.  He alleges he was in so much pain he does not remember being examined or seeing my face before.   PROCEDURES    ED DIAGNOSES     ICD-10-CM   1. Fall, initial encounter  W19.XXXA                    2. Chronic pain due to trauma  G89.21   3. Abnormal behavior  R46.89         Paula Libra, MD 03/03/19 0541    Paula Libra, MD 03/03/19 [phone]

## 2019-03-03 NOTE — ED Notes (Signed)
Pt missed urinal so Pt was assisted in to a gown and scrub bottoms by EMT and NT.

## 2019-03-03 NOTE — ED Triage Notes (Signed)
Patient arrived via EMS c/o unwitnessed fall at unknown time. Patient c/o acute and chronic generalized pain. Patient is AO x 4, VS WDL, unstable gait.

## 2019-03-03 NOTE — ED Notes (Signed)
Patient removed c-collar against medical advice. Provider informed.

## 2019-03-06 MED FILL — FLUAD QUADRIVALENT 0.5 ML P: 0.5 | 1 days supply | Qty: 1 | Fill #0

## 2019-03-06 MED FILL — ATORVASTATIN 40 MG TABLET: 40 | 90 days supply | Qty: 90 | Fill #0

## 2019-03-14 ENCOUNTER — Telehealth: Payer: Self-pay | Admitting: Surgery

## 2019-03-14 ENCOUNTER — Encounter (HOSPITAL_BASED_OUTPATIENT_CLINIC_OR_DEPARTMENT_OTHER): Payer: Self-pay | Admitting: Emergency Medicine

## 2019-03-14 ENCOUNTER — Emergency Department (HOSPITAL_BASED_OUTPATIENT_CLINIC_OR_DEPARTMENT_OTHER)
Admission: EM | Admit: 2019-03-14 | Discharge: 2019-03-14 | Disposition: A | Discharge status: Home / Self Care | Payer: Medicare Other | Attending: Emergency Medicine | Admitting: Emergency Medicine

## 2019-03-14 ENCOUNTER — Telehealth: Payer: Self-pay | Admitting: *Deleted

## 2019-03-14 ENCOUNTER — Other Ambulatory Visit: Payer: Self-pay

## 2019-03-14 DIAGNOSIS — M545 Low back pain: Secondary | ICD-10-CM | POA: Insufficient documentation

## 2019-03-14 DIAGNOSIS — E119 Type 2 diabetes mellitus without complications: Secondary | ICD-10-CM | POA: Diagnosis not present

## 2019-03-14 DIAGNOSIS — F17200 Nicotine dependence, unspecified, uncomplicated: Secondary | ICD-10-CM | POA: Diagnosis not present

## 2019-03-14 DIAGNOSIS — I10 Essential (primary) hypertension: Secondary | ICD-10-CM | POA: Insufficient documentation

## 2019-03-14 DIAGNOSIS — Z7984 Long term (current) use of oral hypoglycemic drugs: Secondary | ICD-10-CM | POA: Insufficient documentation

## 2019-03-14 DIAGNOSIS — R296 Repeated falls: Secondary | ICD-10-CM | POA: Insufficient documentation

## 2019-03-14 DIAGNOSIS — Z79899 Other long term (current) drug therapy: Secondary | ICD-10-CM | POA: Diagnosis not present

## 2019-03-14 DIAGNOSIS — M5489 Other dorsalgia: Secondary | ICD-10-CM | POA: Diagnosis present

## 2019-03-14 DIAGNOSIS — G8929 Other chronic pain: Secondary | ICD-10-CM

## 2019-03-14 HISTORY — DX: Malingerer (conscious simulation): Z76.5

## 2019-03-14 NOTE — ED Triage Notes (Signed)
Pt c/o chronic back pain. Pt states he is out of his pain medication. EMS states pt was sitting in chair on their arrival.

## 2019-03-14 NOTE — ED Notes (Signed)
[facility] called for transport.  

## 2019-03-14 NOTE — ED Notes (Signed)
Pt. Refusing to take of shirt as he states he's "in too much pain". Pt. Asking that we turn off lights, this tech explained that the lights need to be on so we can work on him. Pt. States "none of yall care".

## 2019-03-14 NOTE — Telephone Encounter (Signed)
TOC CM contact pt to arrange HH, no answer. Unable to leave a message. [place], [place] ED TOC CM [phone]

## 2019-03-14 NOTE — ED Provider Notes (Signed)
[facility] EMERGENCY DEPARTMENT Provider Note   CSN: [id number] Arrival date & time: 03/14/19  0306     History   Chief Complaint Chief Complaint  Patient presents with  . Back Pain    HPI Carson Myrtleim G Metsker is a 68 y.o. male.     The history is provided by the patient.  Back Pain Location:  Lumbar spine and thoracic spine Quality:  Aching Pain severity:  Severe Pain is:  Same all the time Timing:  Constant Chronicity:  Chronic Context: falling   Relieved by:  Nothing Worsened by:  Movement Associated symptoms: no bladder incontinence, no bowel incontinence, no chest pain and no headaches   Patient with history of chronic back pain, previous compression fractures, hypertension, depression presents for follow-up. Patient lives in independent living facility and presents after a fall.  He reports one time he fell onto his bed, but the second time he fell while trying to go to the chair.  Denies head injury, denies LOC.  He reports he may have reinjured his back.  He also may have hit his right knee in the fall. No chest pain or shortness of breath.  No abdominal pain. No incontinence, no new weakness. Patient reports he has fallen frequently however since his wife passed away Past Medical History:  Diagnosis Date  . Chronic back pain   . Chronic pain   . Diabetes mellitus without complication ([facility])   . Drug-seeking behavior   . Dyslipidemia   . Hypertension   . Major depressive disorder   . Thyroid disease     There are no active problems to display for this patient.   Past Surgical History:  Procedure Laterality Date  . BACK SURGERY    . KNEE SURGERY          Home Medications    Prior to Admission medications   Medication Sig Start Date End Date Taking? Authorizing Provider  ALPRAZolam Prudy Feeler(XANAX) 1 MG tablet Take 1 mg by mouth at bedtime as needed for anxiety.    [provider]  carisoprodol (SOMA) 350 MG tablet Take 350 mg by mouth 4  (four) times daily as needed for muscle spasms.    [provider]  FLUoxetine (PROZAC) 20 MG tablet Take 20 mg by mouth daily.    [provider]  levothyroxine (SYNTHROID) 137 MCG tablet Take 137 mcg by mouth daily before breakfast.    [provider]  metFORMIN (GLUCOPHAGE) 500 MG tablet Take by mouth 2 (two) times daily with a meal.    [provider]  ondansetron (ZOFRAN ODT) 4 MG disintegrating tablet Take 1 tablet (4 mg total) by mouth every 8 (eight) hours as needed. 02/25/19   Pricilla LovelessGoldston, Scott, MD  oxyCODONE (ROXICODONE) 15 MG immediate release tablet  02/06/19   [provider]  OXYCODONE ER PO Take 60 mg by mouth 2 (two) times daily.     [provider]  potassium chloride SA (K-DUR) 20 MEQ tablet Take 1 tablet (20 mEq total) by mouth daily. 02/25/19   Pricilla LovelessGoldston, Scott, MD  tamsulosin (FLOMAX) 0.4 MG CAPS capsule Take 0.4 mg by mouth.    [provider]    Family History No family history on file.  Social History Social History   Tobacco Use  . Smoking status: Current Every Day Smoker  . Smokeless tobacco: Never Used  Substance Use Topics  . Alcohol use: Not Currently    Frequency: Never  . Drug use: Not on  file     Allergies   Codeine   Review of Systems Review of Systems  Respiratory: Negative for shortness of breath.   Cardiovascular: Negative for chest pain.  Gastrointestinal: Negative for bowel incontinence.  Genitourinary: Negative for bladder incontinence.  Musculoskeletal: Positive for arthralgias and back pain.  Neurological: Negative for headaches.  All other systems reviewed and are negative.    Physical Exam Updated Vital Signs BP (!) 168/107 (BP Location: Right Arm)   Pulse 77   Temp 97.8 F (36.6 C) (Oral)   Resp 18   Wt 59 kg   SpO2 97%   BMI 16.70 kg/m   Physical Exam  CONSTITUTIONAL: Frail and chronically ill-appearing HEAD: Normocephalic/atraumatic, no signs of trauma EYES:  EOMI/PERRL NECK: supple no meningeal signs SPINE/BACK: Kyphotic spine, diffuse thoracic and lumbar tenderness No bruising/crepitance/stepoffs noted to spine CV: S1/S2 noted, no murmurs/rubs/gallops noted LUNGS: Lungs are clear to auscultation bilaterally, no apparent distress Chest-no bruising or crepitus, no tenderness ABDOMEN: soft, nontender GU:no cva tenderness NEURO: Pt is awake/alert/appropriate, moves all extremitiesx4.  No facial droop.  He is able to move both lower extremities but limited due to pain.  No focal weakness noted in lower extremities. EXTREMITIES: pulses normal/equal, full ROM Pelvis stable.  No tenderness or deformity to right knee.  No abrasions noted. SKIN: warm, color normal, no new abrasions noted on torso or extremities PSYCH: Poor eye contact ED Treatments / Results  Labs (all labs ordered are listed, but only abnormal results are displayed) Labs Reviewed - No data to display  EKG None  Radiology No results found.  Procedures Procedures  Medications Ordered in ED Medications - No data to display   Initial Impression / Assessment and Plan / ED Course  I have reviewed the triage vital signs and the nursing notes.      Patient presents for fourth ER visit in this system in the past 6 months.  He has also had ER visits outside this hospital system.  He was recently admitted to [facility] for a fall.  He had a PT evaluation and was strongly recommended to transition to a higher level of care as an outpatient.  The patient resisted change in his current placement Patient reports he has been unable to get his pain medications due to multiple issues.  He reports has been falling frequently, mostly since his wife passed away. Patient has had extensive spinal imaging on previous ER visits that showed chronic compression fractures but no acute findings after falls similar to this  Tonight there is no visible signs of trauma.  He has  diffuse spinal tenderness without any focal abrasions or bruising.  No signs of any traumatic extremity injury.  Patient refuses to get out of bed and even attempt to walk.  However he does not have any focal neuro deficits in his legs, denies any incontinence.  Suspicion for acute spinal pathology is low.  No other signs of acute traumatic injury.    concern for chronic pain, no pain medicines to be given here today.  Patient was instructed to call his pain specialist as soon as possible and number was provided.  Patient is unable to give a clear reason why he has not had any recent pain medications prescribed, but he reports he was not fired from the practice  I strongly encouraged the patient to consider escalating his level of care due to frequent falls I am concerned about new injuries from his falls.  Patient is still hesitant.  I have consulted case management and social work for home health referral and potential placement Final Clinical Impressions(s) / ED Diagnoses   Final diagnoses:  Chronic midline low back pain without sciatica    ED Discharge Orders    None       Zadie Rhine, MD 03/14/19 0406

## 2019-03-15 ENCOUNTER — Telehealth: Payer: Self-pay | Admitting: *Deleted

## 2019-03-15 NOTE — Telephone Encounter (Signed)
TOC CM contacted pt and no answer on phone. Will attempt again to arrange [facility]. [place], [place] ED TOC CM [phone]

## 2019-03-20 MED FILL — oxyCODONE HCL 15 MG TABS: 15 | 30 days supply | Qty: 60 | Fill #0

## 2019-03-20 MED FILL — CARISOPRODOL 350 MG TABS: 350 | 30 days supply | Qty: 60 | Fill #0

## 2019-03-20 MED FILL — OxyCONTIN 60 MG T12A: 60 | 30 days supply | Qty: 60 | Fill #0

## 2019-03-20 MED FILL — ALPRAZolam 1 MG TABS: 1 | 30 days supply | Qty: 30 | Fill #0

## 2019-04-19 MED FILL — MIRTAZAPINE 15 MG TABLET: 15 | 30 days supply | Qty: 30 | Fill #0

## 2019-04-23 MED FILL — ALPRAZolam 1 MG TABS: 1 | 30 days supply | Qty: 30 | Fill #0

## 2019-04-23 MED FILL — OxyCONTIN 60 MG T12A: 60 | 30 days supply | Qty: 60 | Fill #0

## 2019-04-23 MED FILL — oxyCODONE HCL 15 MG TABS: 15 | 30 days supply | Qty: 60 | Fill #0

## 2019-04-24 MED FILL — CARISOPRODOL 350 MG TABS: 350 | 30 days supply | Qty: 60 | Fill #0

## 2019-05-22 MED FILL — oxyCODONE HCL 15 MG TABS: 15 | 30 days supply | Qty: 60 | Fill #0

## 2019-05-22 MED FILL — CARISOPRODOL 350 MG TABS: 350 | 30 days supply | Qty: 60 | Fill #0

## 2019-05-22 MED FILL — OxyCONTIN 60 MG T12A: 60 | 30 days supply | Qty: 60 | Fill #0

## 2019-05-22 MED FILL — ALPRAZolam 1 MG TABS: 1 | 30 days supply | Qty: 30 | Fill #0

## 2019-06-18 MED FILL — DOXYCYCLINE HYC 50 MG CAP: 50 | 4 days supply | Qty: 16 | Fill #0

## 2019-06-21 MED FILL — ESCITALOPRAM 10 MG TABLET: 10 | 30 days supply | Qty: 30 | Fill #0

## 2019-06-21 MED FILL — LEVOTHYROXINE SODIUM 150 MC: 150 | 30 days supply | Qty: 30 | Fill #0

## 2019-06-21 MED FILL — oxyCODONE HCL 15 MG TABS: 15 | 30 days supply | Qty: 60 | Fill #0

## 2019-06-21 MED FILL — OxyCONTIN 60 MG T12A: 60 | 30 days supply | Qty: 60 | Fill #0

## 2019-06-21 MED FILL — ALPRAZolam 1 MG TABS: 1 | 30 days supply | Qty: 30 | Fill #0

## 2019-06-21 MED FILL — CARISOPRODOL 350 MG TABS: 350 | 30 days supply | Qty: 60 | Fill #0

## 2021-04-25 IMAGING — CT CT L SPINE W/O CM
3 series · 12 of 33 positions shown, 14 images · non-contrast
Comparison: Abdominal CT from 3 days ago

CLINICAL DATA: Low back pain after minor trauma

EXAM:
CT LUMBAR SPINE WITHOUT CONTRAST
TECHNIQUE: Multidetector CT imaging of the lumbar spine was performed without
intravenous contrast administration. Multiplanar CT image
reconstructions were also generated.

[Series 5: l spine soft axial · axial · 0.34mm/px · z∈[-170,+20]mm · 4 of 137 slices shown, 5 images]
[im 21/137  soft-tissue]
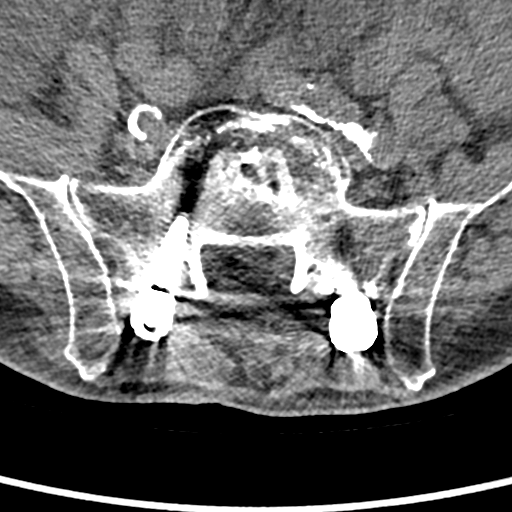
[im 21/137  bone]
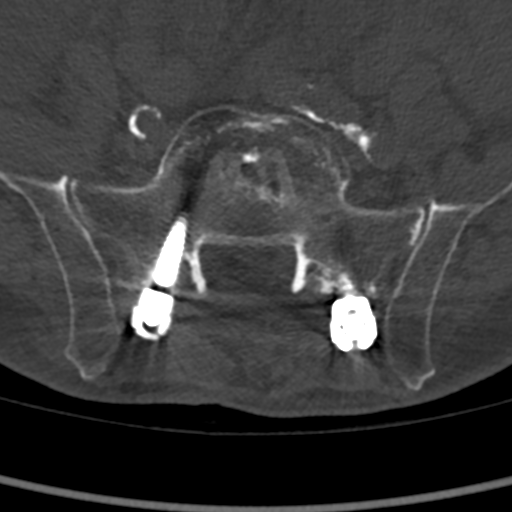
[im 53/137  bone]
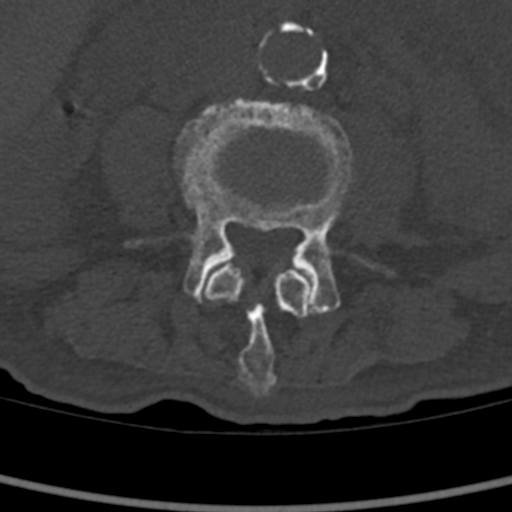
[im 84/137  bone]
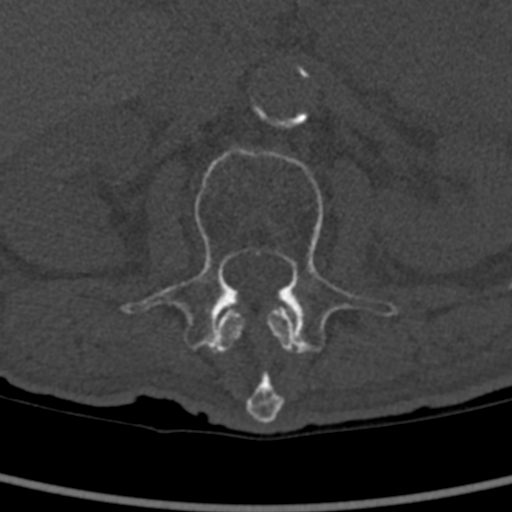
[im 116/137  bone]
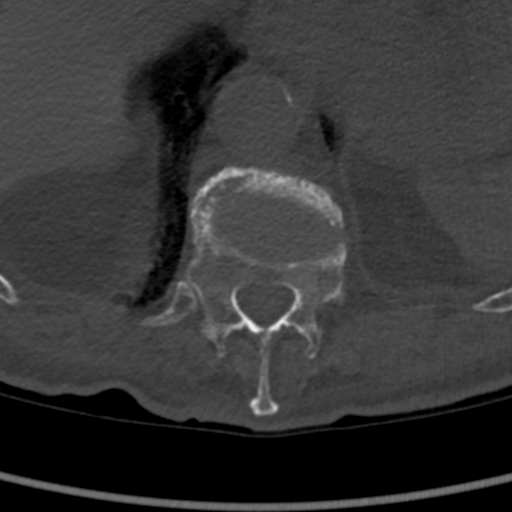

[Series 6: sagittal bone · sagittal · 0.40mm/px · 5 of 61 slices shown, 6 images]
[im 21/61  bone]
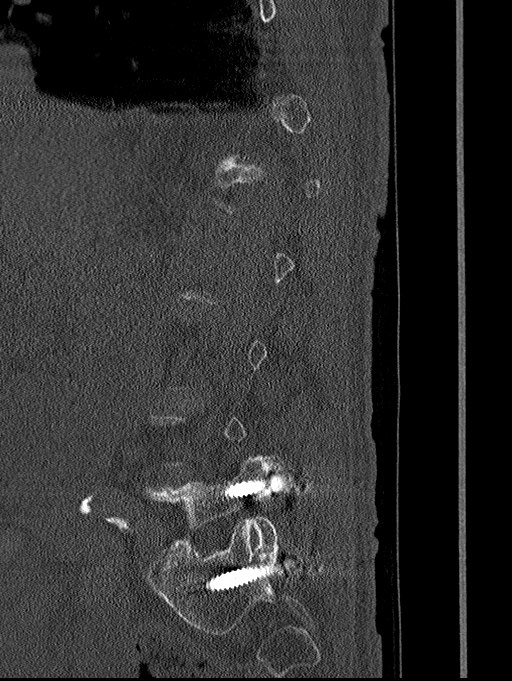
[im 26/61  bone]
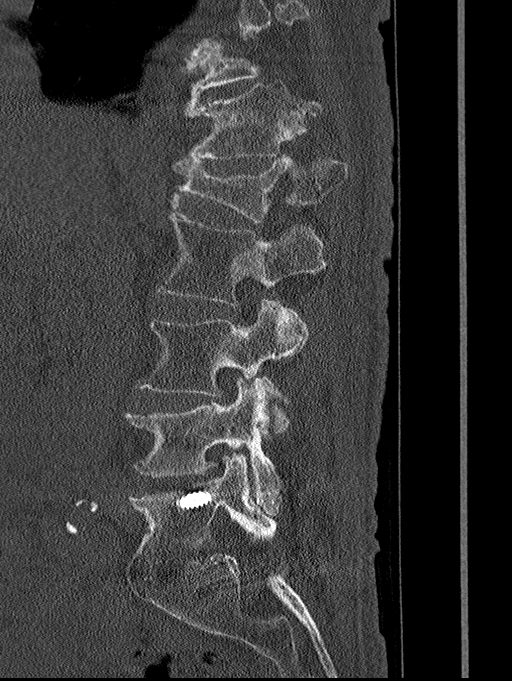
[im 31/61  soft-tissue]
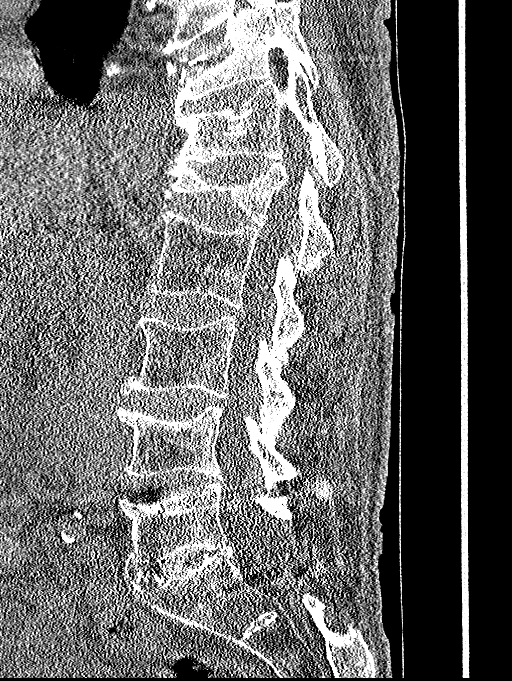
[im 31/61  bone]
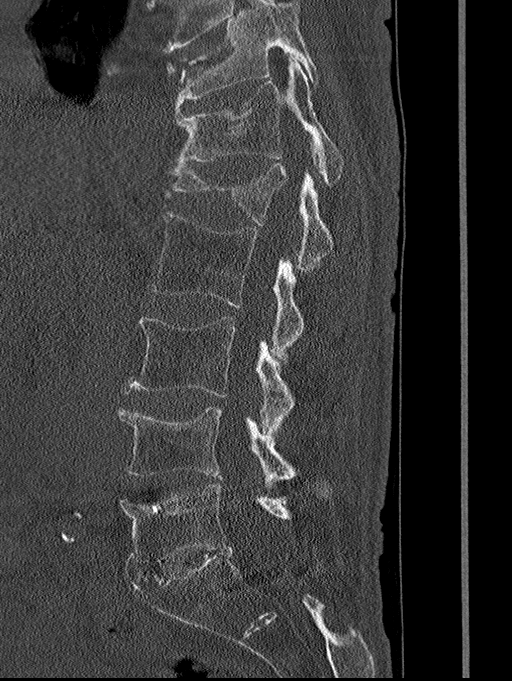
[im 36/61  bone]
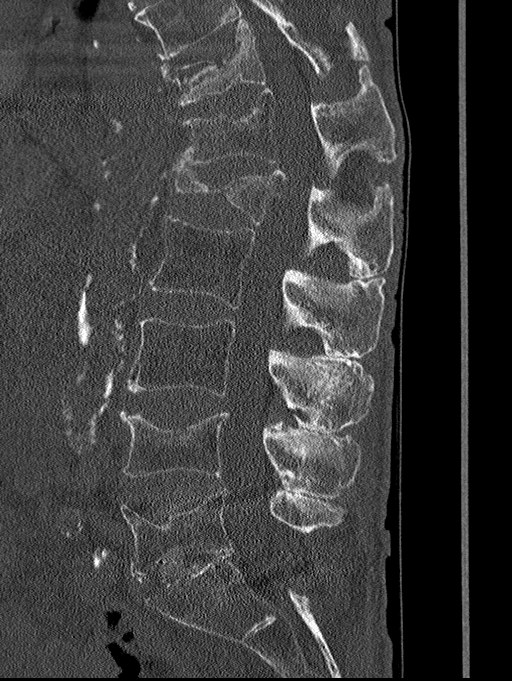
[im 41/61  bone]
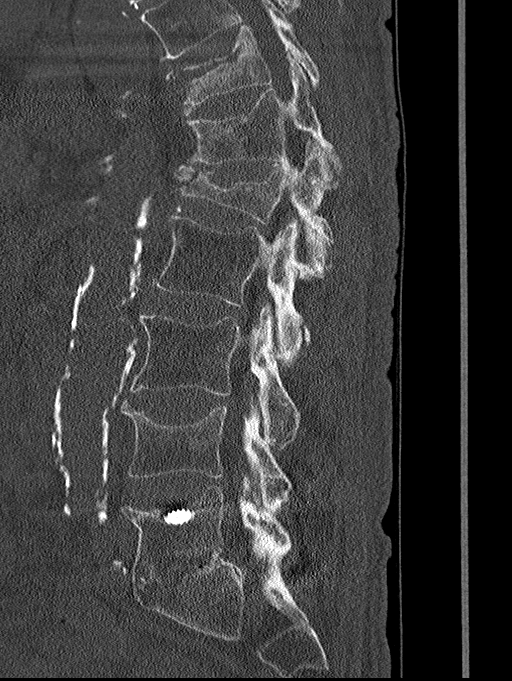

[Series 7: coronal bone l-sp · coronal · 0.33mm/px · 3 of 68 slices shown]
[im 14/68  bone]
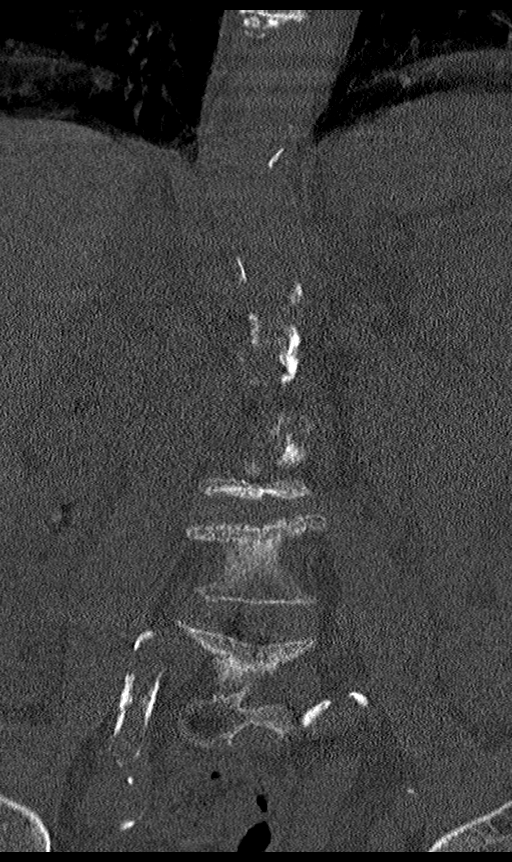
[im 27/68  bone]
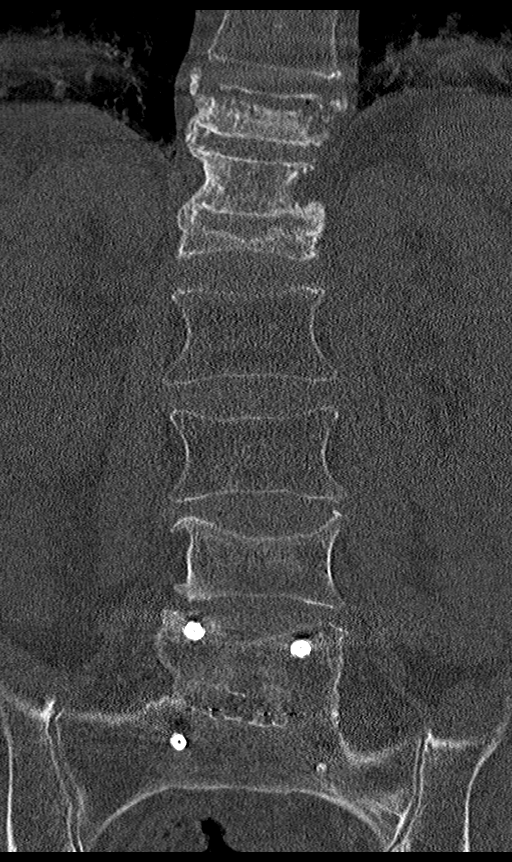
[im 41/68  bone]
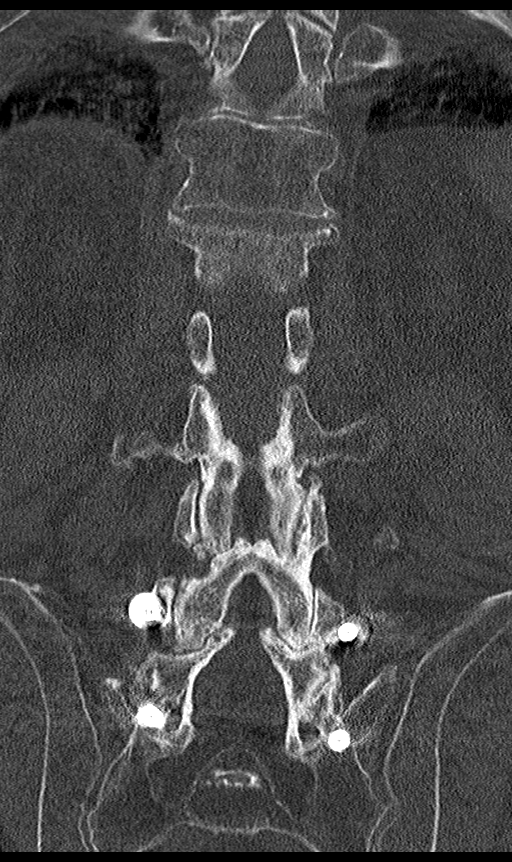

[12 of 33 positions shown; findings below may reference images not displayed]

FINDINGS: Segmentation: Standard lumbar numbering

Alignment: Exaggerated kyphosis at the thoracolumbar junction due to
compression deformities

Vertebrae: Remote T11, T12, L1, L4, and L5 compression fractures.
The T12 and lumbar fractures are healed. The T11 fracture is
described on dedicated study. Height loss is advanced at L1 where
there is also moderate retropulsion and spinal stenosis.

No acute fracture, erosion, or bone lesion.

L5-S1 PLIF with solid arthrodesis

Paraspinal and other soft tissues: Extensive atherosclerotic
calcification. No acute soft tissue finding

Disc levels: Degenerative facet spurring at L3-4 and L4-5 with
moderate right foraminal stenosis at L4-5. Laminectomy at L5-S1. no
degenerative spinal stenosis.
IMPRESSION: 1. No acute finding.
2. Remote L1, L4, and L5 compression fractures most notable at L1
where there is advanced height loss and moderate retropulsion with
spinal stenosis.
3. L5-S1 PLIF with solid arthrodesis.
4. Moderate degenerative foraminal stenosis on the right at L4-5

## 2021-04-25 IMAGING — CT CT CERVICAL SPINE W/O CM
3 of 4 series · 14 of 33 positions shown, 17 images · non-contrast
Comparison: 12/26/2018

CLINICAL DATA: Fall with diffuse pain.

EXAM:
CT CERVICAL SPINE WITHOUT CONTRAST
TECHNIQUE: Multidetector CT imaging of the cervical spine was performed without
intravenous contrast. Multiplanar CT image reconstructions were also
generated.

[Series 5: sagittal bone c-sp · sagittal · 0.27mm/px · 5 of 53 slices shown, 6 images]
[im 18/53  bone]
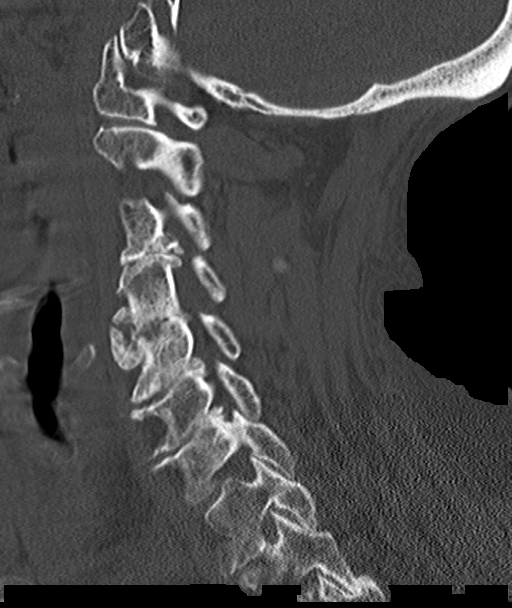
[im 22/53  bone]
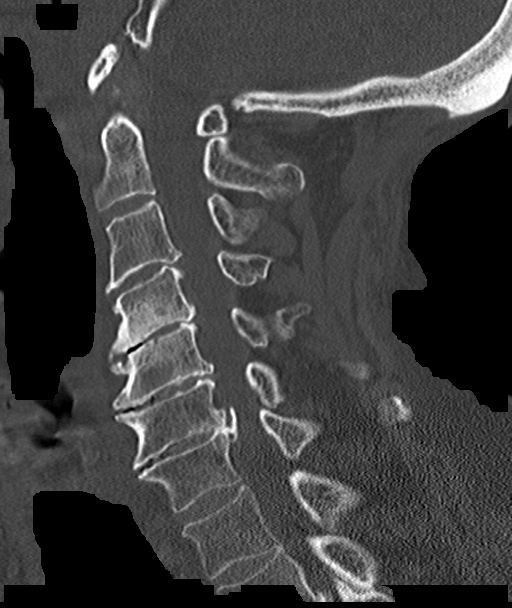
[im 27/53  soft-tissue]
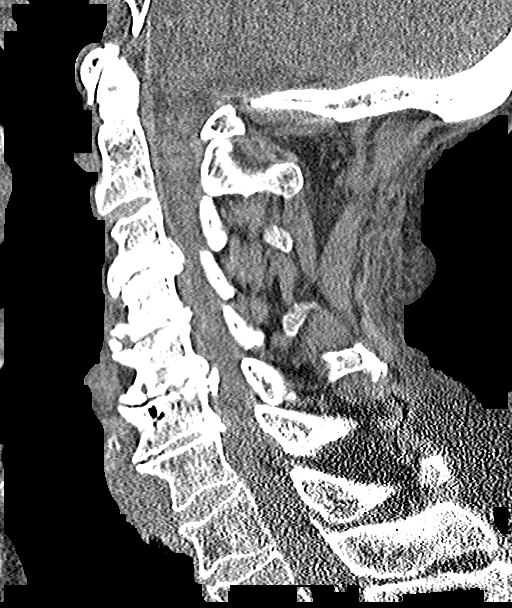
[im 27/53  bone]
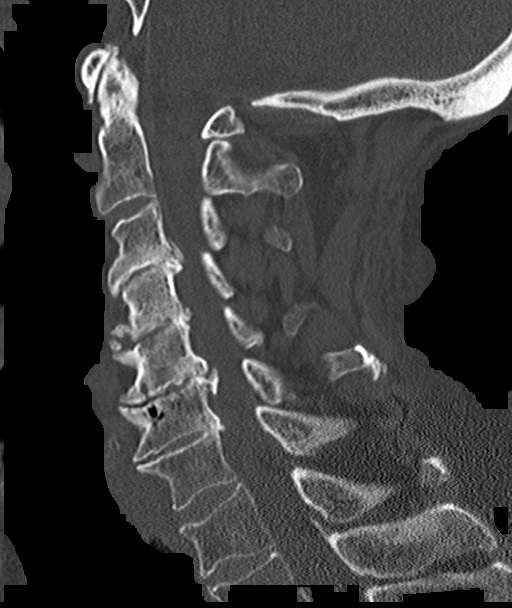
[im 31/53  bone]
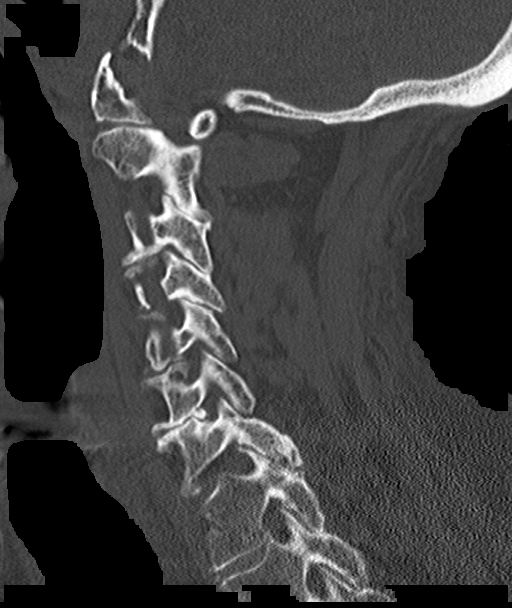
[im 35/53  bone]
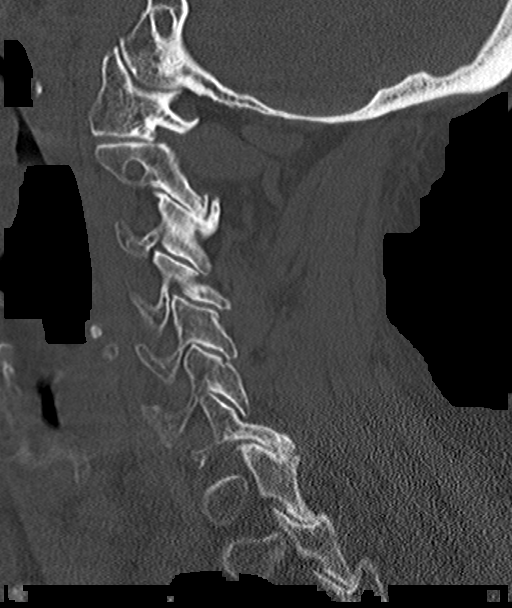

[Series 6: coronal bone c-sp · coronal · 0.26mm/px · 3 of 55 slices shown]
[im 11/55  bone]
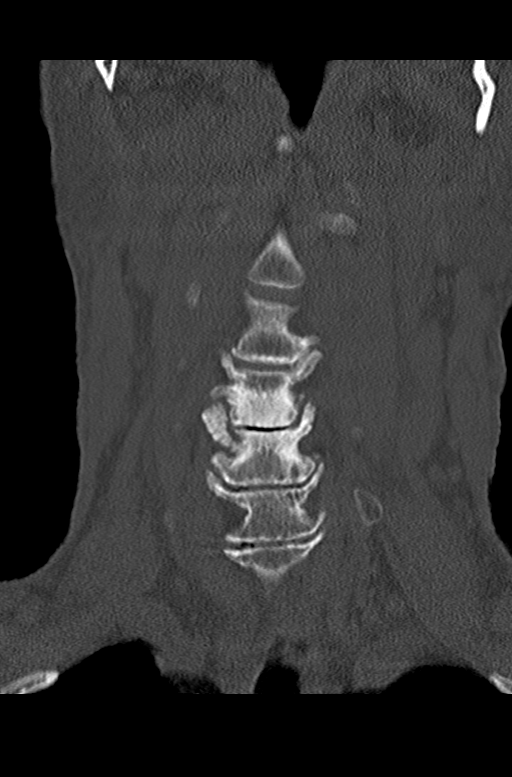
[im 22/55  bone]
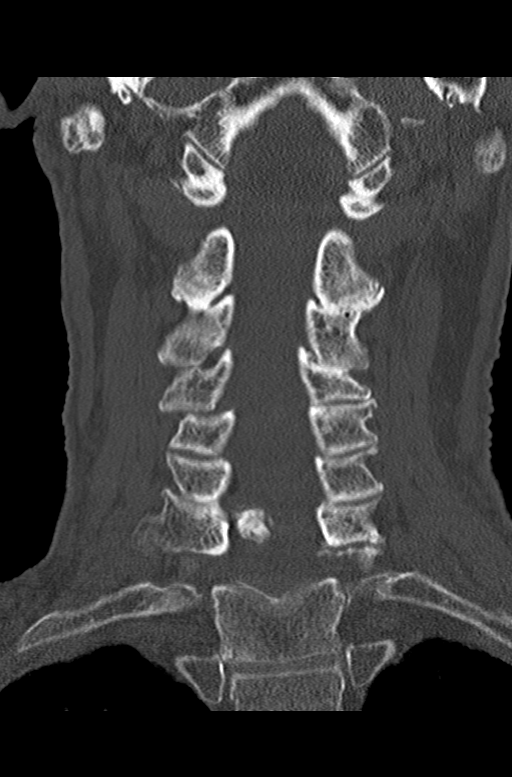
[im 33/55  bone]
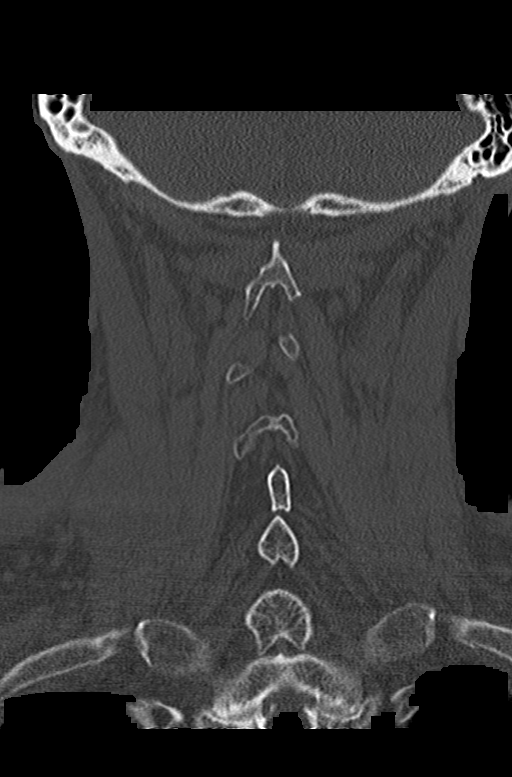

[Series 7: orthogonal bone · axial · 0.28mm/px · z∈[-232,-96]mm · 6 of 100 slices shown, 8 images]
[im 15/100  soft-tissue]
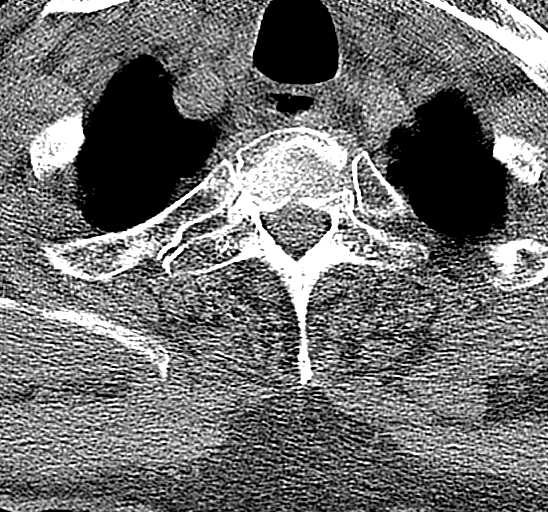
[im 15/100  bone]
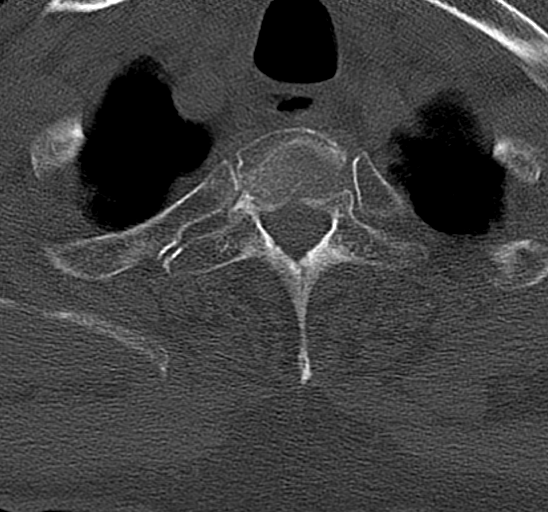
[im 29/100  bone]
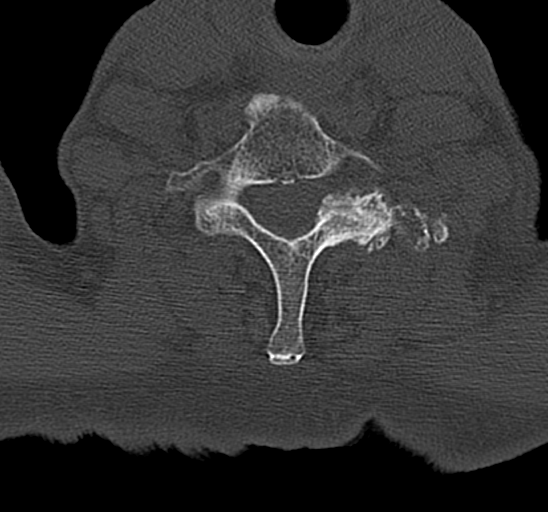
[im 43/100  bone]
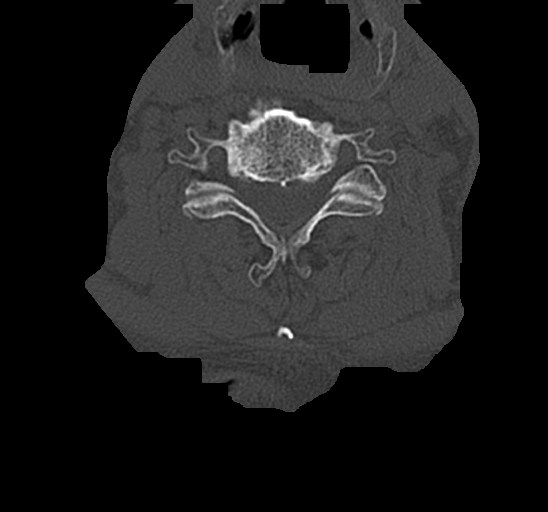
[im 57/100  bone]
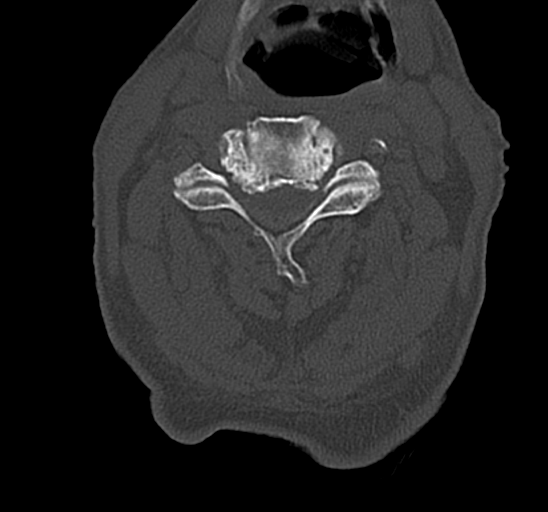
[im 71/100  soft-tissue]
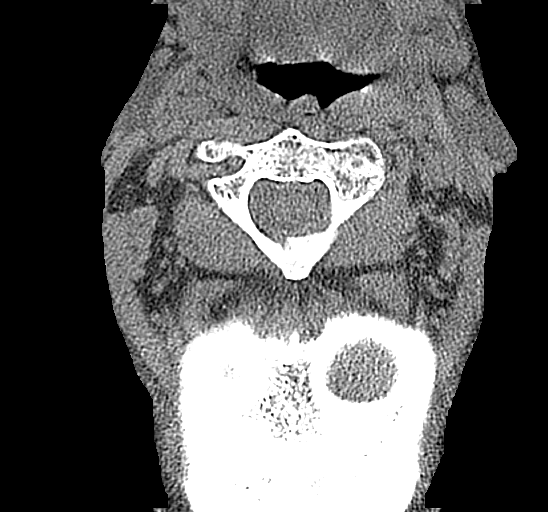
[im 71/100  bone]
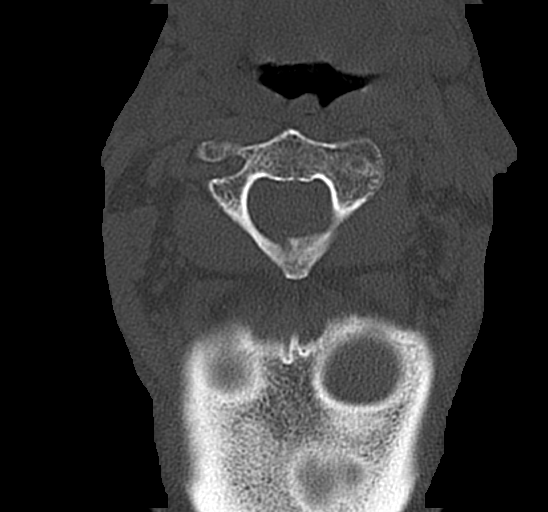
[im 85/100  bone]
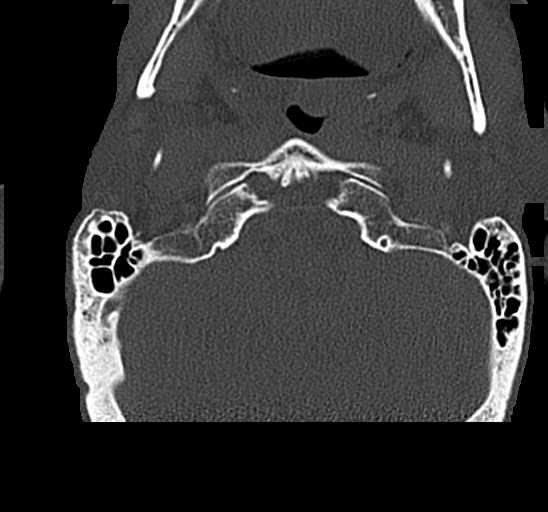

[14 of 33 positions shown; findings below may reference images not displayed]

FINDINGS: Alignment: No traumatic malalignment

Skull base and vertebrae: Negative for fracture

Soft tissues and spinal canal: No prevertebral fluid or swelling. No
visible canal hematoma.

Disc levels: Degenerative disc narrowing and ridging at C3-4 to C6-7
with foraminal impingement most notable at C3-4 and C6-7 where
foraminal narrowing is severe. Endplate ridging at these levels
presumably impinges on the cord, especially in the right canal at
C6-7.

Upper chest: Negative
IMPRESSION: 1. Negative for acute fracture.
2. Advanced disc and uncovertebral degeneration from C3-4 to C6-7
causing multilevel cord and foraminal impingement.
